# Patient Record
Sex: Female | Born: 1975 | Race: Black or African American | Hispanic: No | Marital: Single | Smoking: Never smoker
Health system: Southern US, Community
[De-identification: ages and names within clinical notes are randomized; demographics above are authoritative.]

## PROBLEM LIST (undated history)

## (undated) DIAGNOSIS — F32A Depression, unspecified: Secondary | ICD-10-CM

## (undated) DIAGNOSIS — J40 Bronchitis, not specified as acute or chronic: Secondary | ICD-10-CM

## (undated) DIAGNOSIS — F419 Anxiety disorder, unspecified: Secondary | ICD-10-CM

## (undated) DIAGNOSIS — F988 Other specified behavioral and emotional disorders with onset usually occurring in childhood and adolescence: Secondary | ICD-10-CM

## (undated) DIAGNOSIS — F329 Major depressive disorder, single episode, unspecified: Secondary | ICD-10-CM

## (undated) HISTORY — DX: Major depressive disorder, single episode, unspecified: F32.9

## (undated) HISTORY — DX: Other specified behavioral and emotional disorders with onset usually occurring in childhood and adolescence: F98.8

## (undated) HISTORY — PX: ECTOPIC PREGNANCY SURGERY: SHX613

## (undated) HISTORY — DX: Depression, unspecified: F32.A

## (undated) HISTORY — DX: Anxiety disorder, unspecified: F41.9

---

## 2012-08-22 ENCOUNTER — Emergency Department (HOSPITAL_COMMUNITY)
Admission: EM | Admit: 2012-08-22 | Discharge: 2012-08-22 | Disposition: A | Discharge status: Home / Self Care | Payer: PRIVATE HEALTH INSURANCE | Source: Home / Self Care | Attending: Family Medicine | Admitting: Family Medicine

## 2012-08-22 ENCOUNTER — Encounter (HOSPITAL_COMMUNITY): Payer: Self-pay | Admitting: Emergency Medicine

## 2012-08-22 DIAGNOSIS — F5102 Adjustment insomnia: Secondary | ICD-10-CM

## 2012-08-22 MED ORDER — ZOLPIDEM TARTRATE 10 MG PO TABS
10.0000 mg | ORAL_TABLET | Freq: Every evening | ORAL | Status: DC | PRN
Start: 2012-08-22 — End: 2012-09-04

## 2012-08-22 NOTE — ED Provider Notes (Signed)
History     CSN: [id number]  Arrival date & time 08/22/12  1520   First MD Initiated Contact with Patient 08/22/12 1615      Chief Complaint  Patient presents with  . Insomnia    (Consider location/radiation/quality/duration/timing/severity/associated sxs/prior treatment) Patient is a 36 y.o. female presenting with general illness. The history is provided by the patient.  Illness  The current episode started more than 2 weeks ago (works 3rd shift and having troule sleeping during day.). The onset was gradual. The problem has been unchanged. The problem is mild. Pertinent negatives include no headaches.    History reviewed. No pertinent past medical history.  History reviewed. No pertinent past surgical history.  No family history on file.  History  Substance Use Topics  . Smoking status: Never Smoker   . Smokeless tobacco: Not on file  . Alcohol Use: No    OB History    Grav Para Term Preterm Abortions TAB SAB Ect Mult Living                  Review of Systems  Constitutional: Negative.   Neurological: Negative for dizziness, numbness and headaches.  Psychiatric/Behavioral: Positive for sleep disturbance. Negative for dysphoric mood. The patient is not nervous/anxious.     Allergies  Review of patient's allergies indicates no known allergies.  Home Medications   Current Outpatient Rx  Name  Route  Sig  Dispense  Refill  . ZOLPIDEM TARTRATE 10 MG PO TABS   Oral   Take 1 tablet (10 mg total) by mouth at bedtime as needed for sleep.   10 tablet   0     BP 134/99  Pulse 91  Temp 98.6 F (37 C) (Oral)  Resp 16  SpO2 100%  LMP 08/16/2012  Physical Exam  Nursing note and vitals reviewed. Constitutional: She is oriented to person, place, and time. She appears well-developed and well-nourished.  HENT:  Head: Normocephalic.  Eyes: Conjunctivae normal are normal. Pupils are equal, round, and reactive to light.  Neck: Normal range of motion. Neck supple.    Lymphadenopathy:    She has no cervical adenopathy.  Neurological: She is alert and oriented to person, place, and time.  Skin: Skin is warm and dry.  Psychiatric: She has a normal mood and affect. Her behavior is normal. Judgment and thought content normal.    ED Course  Procedures (including critical care time)  Labs Reviewed - No data to display No results found.   1. Insomnia, transient       MDM          Linna Hoff, MD 08/22/12 1701

## 2012-08-22 NOTE — ED Notes (Signed)
Reports working night shift, difficulty sleeping, onset 2 weeks ago.

## 2012-09-04 ENCOUNTER — Ambulatory Visit (INDEPENDENT_AMBULATORY_CARE_PROVIDER_SITE_OTHER): Payer: Managed Care, Other (non HMO) | Admitting: Family Medicine

## 2012-09-04 VITALS — BP 124/81 | HR 93 | Temp 97.4°F | Resp 16 | Ht 62.5 in | Wt 224.6 lb

## 2012-09-04 DIAGNOSIS — G4700 Insomnia, unspecified: Secondary | ICD-10-CM

## 2012-09-04 MED ORDER — ZOLPIDEM TARTRATE 10 MG PO TABS: 10.0000 mg | ORAL_TABLET | Freq: Every evening | ORAL | Status: DC | PRN

## 2012-09-04 NOTE — Progress Notes (Signed)
Patient ID: Brandy York, female   DOB: [birth year], 36 y.o.   MRN: [id number] Brandy York is a 36 y.o. female who presents to Urgent Care today for insomnia:  1.  Insomnia: New job, working 3rd shift for the past month.  She is having great difficulty falling asleep during the day.  Presented to Urgent Care about 2 weeks ago, prescribed 10 Ambien, stated this greatly helped her falling asleep.  No trouble with sleeping before move to 3rd shift.  No history of anxiety or depression.  No narcolepsy.     PMH reviewed.  ROS as above otherwise neg.  No chest pain, palpitations, SOB, Fever, Chills, Abd pain, N/V/D.  Medications reviewed. Current Outpatient Prescriptions  Medication Sig Dispense Refill  . zolpidem (AMBIEN) 10 MG tablet Take 1 tablet (10 mg total) by mouth at bedtime as needed for sleep.  10 tablet  0    Exam:  BP 124/81  Pulse 93  Temp 97.4 F (36.3 C) (Oral)  Resp 16  Ht 5' 2.5" (1.588 m)  Wt 224 lb 9.6 oz (101.878 kg)  BMI 40.43 kg/m2  SpO2 99%  LMP 08/16/2012 Gen: Well NAD HEENT: EOMI,  MMM Lungs: CTABL Nl WOB Heart: RRR no MRG Psych:  Alert, oriented x 3, not depressed or anxious appearing, linear and coherent thought process  Assessment and Plan:  1.  Insomnia:  Short term course of Ambien.  Recommended switch to Melatonin for more long-term help with insomnia.

## 2012-09-04 NOTE — Patient Instructions (Signed)
The Ambien works well for the short-term.  A better long-term solution is Melatonin.  Start with a small dose and work up from there.  It was good to meet you today!

## 2012-09-06 NOTE — Progress Notes (Signed)
I have read, reviewed, and agree with plan of care by Dr. Gwendolyn Grant Of note if she does call back with persistent insomnia I think that using Ambien some days of the week would be ok for her particular situation.

## 2012-09-09 ENCOUNTER — Ambulatory Visit (INDEPENDENT_AMBULATORY_CARE_PROVIDER_SITE_OTHER): Payer: Managed Care, Other (non HMO) | Admitting: Emergency Medicine

## 2012-09-09 VITALS — BP 148/96 | HR 86 | Temp 99.0°F | Resp 18 | Ht 63.0 in | Wt 223.0 lb

## 2012-09-09 DIAGNOSIS — R8281 Pyuria: Secondary | ICD-10-CM

## 2012-09-09 DIAGNOSIS — N3000 Acute cystitis without hematuria: Secondary | ICD-10-CM

## 2012-09-09 LAB — POCT UA - MICROSCOPIC ONLY
Casts, Ur, LPF, POC: NEGATIVE
Yeast, UA: NEGATIVE

## 2012-09-09 LAB — POCT URINALYSIS DIPSTICK
Bilirubin, UA: NEGATIVE
Glucose, UA: NEGATIVE
Nitrite, UA: NEGATIVE
Urobilinogen, UA: 0.2

## 2012-09-09 MED ORDER — SULFAMETHOXAZOLE-TRIMETHOPRIM 800-160 MG PO TABS: 1.0000 | ORAL_TABLET | Freq: Two times a day (BID) | ORAL | Status: DC

## 2012-09-09 NOTE — Progress Notes (Signed)
[facility] [address] [place] [address] [phone]  Date:  09/09/2012   Name:  Brandy York   DOB:  09-Jul-1976   MRN:  [id number]  PCP:  No primary provider on file.    Chief Complaint: Annual Exam, Fever and Nausea   History of Present Illness:  Brandy York is a 36 y.o. very pleasant female patient who presents with the following:  Sent home from work after night shift where she was checked for dizziness.  No other symptoms.  Not sexually active. Told at work examination that she had WBC's in urine.  No other complaints.    There is no problem list on file for this patient.   History reviewed. No pertinent past medical history.  History reviewed. No pertinent past surgical history.  History  Substance Use Topics  . Smoking status: Never Smoker   . Smokeless tobacco: Not on file  . Alcohol Use: No    Family History  Problem Relation Age of Onset  . Hypertension Mother     No Known Allergies  Medication list has been reviewed and updated.  Current Outpatient Prescriptions on File Prior to Visit  Medication Sig Dispense Refill  . zolpidem (AMBIEN) 10 MG tablet Take 1 tablet (10 mg total) by mouth at bedtime as needed for sleep.  25 tablet  0    Review of Systems:  As per HPI, otherwise negative.    Physical Examination: Filed Vitals:   09/09/12 0846  BP: 148/96  Pulse: 86  Temp: 99 F (37.2 C)  Resp: 18   Filed Vitals:   09/09/12 0846  Height: 5\' 3"  (1.6 m)  Weight: 223 lb (101.152 kg)   Body mass index is 39.50 kg/(m^2). Ideal Body Weight: Weight in (lb) to have BMI = 25: 140.8   GEN: WDWN, NAD, Non-toxic, A & O x 3 HEENT: Atraumatic, Normocephalic. Neck supple. No masses, No LAD. Ears and Nose: No external deformity. CV: RRR, No M/G/R. No JVD. No thrill. No extra heart sounds. PULM: CTA B, no wheezes, crackles, rhonchi. No retractions. No resp. distress. No accessory muscle use. ABD: S, NT, ND, +BS. No  rebound. No HSM. EXTR: No c/c/e NEURO Normal gait.  PSYCH: Normally interactive. Conversant. Not depressed or anxious appearing.  Calm demeanor.    Assessment and Plan: Borderline hypertension History of pyuria UA Follow up as needed. Lose weight  Carmelina Dane, MD  Results for orders placed in visit on 09/09/12  POCT UA - MICROSCOPIC ONLY      Component Value Range   WBC, Ur, HPF, POC 4-6     RBC, urine, microscopic 0-1     Bacteria, U Microscopic 1+     Mucus, UA positive     Epithelial cells, urine per micros 6-10     Crystals, Ur, HPF, POC neg     Casts, Ur, LPF, POC neg     Yeast, UA neg    POCT URINALYSIS DIPSTICK      Component Value Range   Color, UA yellow     Clarity, UA cloudy     Glucose, UA neg     Bilirubin, UA neg     Ketones, UA trace     Spec Grav, UA 1.025     Blood, UA neg     pH, UA 6.0     Protein, UA neg     Urobilinogen, UA 0.2     Nitrite, UA neg  Leukocytes, UA small (1+)

## 2012-09-10 ENCOUNTER — Telehealth: Payer: Self-pay | Admitting: Family Medicine

## 2012-09-10 NOTE — Telephone Encounter (Signed)
patient called stating was seen yesterday and we sent in rx but pharmacy didn't have it. Since they didn't have it at [facility] wanted called in to [facility]. Called in bactrim ds 800-160mg  1 po bid #20.

## 2012-09-19 ENCOUNTER — Ambulatory Visit (INDEPENDENT_AMBULATORY_CARE_PROVIDER_SITE_OTHER): Payer: Managed Care, Other (non HMO) | Admitting: Emergency Medicine

## 2012-09-19 VITALS — BP 114/78 | HR 101 | Temp 98.7°F | Resp 16 | Ht 63.0 in | Wt 218.0 lb

## 2012-09-19 DIAGNOSIS — R8290 Unspecified abnormal findings in urine: Secondary | ICD-10-CM

## 2012-09-19 DIAGNOSIS — J018 Other acute sinusitis: Secondary | ICD-10-CM

## 2012-09-19 DIAGNOSIS — J209 Acute bronchitis, unspecified: Secondary | ICD-10-CM

## 2012-09-19 LAB — POCT URINALYSIS DIPSTICK
Blood, UA: NEGATIVE
Glucose, UA: NEGATIVE
Nitrite, UA: NEGATIVE
Protein, UA: NEGATIVE
Urobilinogen, UA: 0.2

## 2012-09-19 LAB — POCT UA - MICROSCOPIC ONLY
Crystals, Ur, HPF, POC: NEGATIVE
Yeast, UA: NEGATIVE

## 2012-09-19 MED ORDER — HYDROCOD POLST-CHLORPHEN POLST 10-8 MG/5ML PO LQCR: 5.0000 mL | Freq: Two times a day (BID) | ORAL | Status: DC | PRN

## 2012-09-19 MED ORDER — PSEUDOEPHEDRINE-GUAIFENESIN ER 60-600 MG PO TB12: 1.0000 | ORAL_TABLET | Freq: Two times a day (BID) | ORAL | Status: DC

## 2012-09-19 MED ORDER — AMOXICILLIN-POT CLAVULANATE 875-125 MG PO TABS: 1.0000 | ORAL_TABLET | Freq: Two times a day (BID) | ORAL | Status: DC

## 2012-09-19 NOTE — Progress Notes (Signed)
[facility] [address], [place] [address] [phone]  Date:  09/19/2012   Name:  Brandy York   DOB:  1976/02/04   MRN:  [id number]  PCP:  No primary provider on file.    Chief Complaint: Follow-up and Laryngitis   History of Present Illness:  Brandy York is a 36 y.o. very pleasant female patient who presents with the following:  Seen 11/25 for cystitis and did not comply with treatment.  Finished five days.  Now complaining of dysuria and frequency. No vaginal discharge or bleeding. No dyspareunia.  Has URI.  Has a cough productive green sputum no wheezing or shortness of breath.  Has green post nasal drainage. No fever or chills.  No nausea or vomiting.  No headache or sinus pain or pressure.  No improvement in symptoms with OTC medication.  There is no problem list on file for this patient.   History reviewed. No pertinent past medical history.  History reviewed. No pertinent past surgical history.  History  Substance Use Topics  . Smoking status: Never Smoker   . Smokeless tobacco: Not on file  . Alcohol Use: No    Family History  Problem Relation Age of Onset  . Hypertension Mother     No Known Allergies  Medication list has been reviewed and updated.  Current Outpatient Prescriptions on File Prior to Visit  Medication Sig Dispense Refill  . zolpidem (AMBIEN) 10 MG tablet Take 1 tablet (10 mg total) by mouth at bedtime as needed for sleep.  25 tablet  0    Review of Systems:  As per HPI, otherwise negative.     Physical Examination: Filed Vitals:   09/19/12 1611  BP: 114/78  Pulse: 101  Temp: 98.7 F (37.1 C)  Resp: 16   Filed Vitals:   09/19/12 1611  Height: 5\' 3"  (1.6 m)  Weight: 218 lb (98.884 kg)   Body mass index is 38.62 kg/(m^2). Ideal Body Weight: Weight in (lb) to have BMI = 25: 140.8   GEN: WDWN, NAD, Non-toxic, A & O x 3 HEENT: Atraumatic, Normocephalic. Neck supple. No masses, No LAD. Ears and  Nose: No external deformity. CV: RRR, No M/G/R. No JVD. No thrill. No extra heart sounds. PULM: CTA B, no wheezes, crackles, rhonchi. No retractions. No resp. distress. No accessory muscle use. ABD: S, NT, ND, +BS. No rebound. No HSM. EXTR: No c/c/e NEURO Normal gait.  PSYCH: Normally interactive. Conversant. Not depressed or anxious appearing.  Calm demeanor.    Assessment and Plan: Sinusitis Bronchitis augmentin mucinex tussionex Follow up as needed  Urine c&s  Carmelina Dane, MD  Results for orders placed in visit on 09/19/12  POCT URINALYSIS DIPSTICK      Component Value Range   Color, UA yellow     Clarity, UA clear     Glucose, UA neg     Bilirubin, UA neg     Ketones, UA trace     Spec Grav, UA 1.025     Blood, UA neg     pH, UA 5.5     Protein, UA neg     Urobilinogen, UA 0.2     Nitrite, UA neg     Leukocytes, UA Trace    POCT UA - MICROSCOPIC ONLY      Component Value Range   WBC, Ur, HPF, POC 3-8     RBC, urine, microscopic 0-1     Bacteria, U Microscopic trace  Mucus, UA trace     Epithelial cells, urine per micros 4-10     Crystals, Ur, HPF, POC neg     Casts, Ur, LPF, POC neg     Yeast, UA neg

## 2012-09-21 LAB — URINE CULTURE
Colony Count: NO GROWTH
Organism ID, Bacteria: NO GROWTH

## 2012-09-23 NOTE — Progress Notes (Signed)
Reviewed and agree.

## 2012-09-27 ENCOUNTER — Telehealth: Payer: Self-pay | Admitting: Radiology

## 2012-09-27 ENCOUNTER — Telehealth: Payer: Self-pay

## 2012-09-27 NOTE — Telephone Encounter (Signed)
Patient called 5 days ago and again today (09/27/12) and we told her we were sending her prescription for cough syrup and maybe an antibiotic that she expected to be called in to [facility] on [address] and someone told her they were "on it" and [facility] still hasn't received anything. [phone]

## 2012-09-27 NOTE — Telephone Encounter (Signed)
Patient states she did not get her Rx for antibiotic that was sent in for her on 09/19/12, I called [facility], left message since I could not get an answer at the pharmacy.

## 2012-09-30 NOTE — Telephone Encounter (Signed)
I called in the antibiotic, but she had already gotten it. I did not tell her we would renew the cough meds, if she needs renewal on the cough meds, she needs to return to clinic. I have left message for her, to clarify. Advised if she has questions she is to call me back.

## 2012-10-03 ENCOUNTER — Telehealth: Payer: Self-pay

## 2012-10-03 NOTE — Telephone Encounter (Signed)
Ok to give her #30 ambien 10 mg with 1RF.  However, please call her and remind there that using ambien every day can increase risk of dependence.  Also, as she is a female I would recommend that she try a 1/2 tablet as this may be sufficient.

## 2012-10-03 NOTE — Telephone Encounter (Signed)
PT IN NEED OF HER AMBIEN. PLEASE CALL [phone]

## 2012-10-03 NOTE — Telephone Encounter (Signed)
Please advise, does this go to Dr Patsy Lager or Dr Gwendolyn Grant?

## 2012-10-04 ENCOUNTER — Telehealth: Payer: Self-pay | Admitting: Radiology

## 2012-10-04 MED ORDER — ZOLPIDEM TARTRATE 10 MG PO TABS: 5.0000 mg | ORAL_TABLET | Freq: Every evening | ORAL | Status: DC | PRN

## 2012-10-04 NOTE — Telephone Encounter (Signed)
Called in her Rx again, she states they did not get it.

## 2012-10-04 NOTE — Telephone Encounter (Signed)
Patient advised.

## 2012-10-18 ENCOUNTER — Ambulatory Visit (INDEPENDENT_AMBULATORY_CARE_PROVIDER_SITE_OTHER): Payer: Managed Care, Other (non HMO) | Admitting: Family Medicine

## 2012-10-18 VITALS — BP 132/98 | HR 117 | Temp 98.6°F | Resp 16 | Ht 60.5 in | Wt 217.0 lb

## 2012-10-18 DIAGNOSIS — F341 Dysthymic disorder: Secondary | ICD-10-CM

## 2012-10-18 DIAGNOSIS — F4310 Post-traumatic stress disorder, unspecified: Secondary | ICD-10-CM | POA: Insufficient documentation

## 2012-10-18 DIAGNOSIS — F988 Other specified behavioral and emotional disorders with onset usually occurring in childhood and adolescence: Secondary | ICD-10-CM

## 2012-10-18 DIAGNOSIS — F418 Other specified anxiety disorders: Secondary | ICD-10-CM | POA: Insufficient documentation

## 2012-10-18 LAB — POCT CBC
Granulocyte percent: 49.2 %G (ref 37–80)
HCT, POC: 40.9 % (ref 37.7–47.9)
Hemoglobin: 12.8 g/dL (ref 12.2–16.2)
MCV: 86 fL (ref 80–97)
POC Granulocyte: 3.6 (ref 2–6.9)
POC LYMPH PERCENT: 43.5 %L (ref 10–50)
RBC: 4.75 M/uL (ref 4.04–5.48)
RDW, POC: 15.7 %

## 2012-10-18 LAB — COMPREHENSIVE METABOLIC PANEL
Albumin: 4.1 g/dL (ref 3.5–5.2)
Alkaline Phosphatase: 39 U/L (ref 39–117)
BUN: 9 mg/dL (ref 6–23)
CO2: 27 mEq/L (ref 19–32)
Calcium: 9.4 mg/dL (ref 8.4–10.5)
Chloride: 101 mEq/L (ref 96–112)
Glucose, Bld: 81 mg/dL (ref 70–99)
Potassium: 4.1 mEq/L (ref 3.5–5.3)
Sodium: 134 mEq/L — ABNORMAL LOW (ref 135–145)
Total Protein: 7.3 g/dL (ref 6.0–8.3)

## 2012-10-18 LAB — TSH: TSH: 2.767 u[IU]/mL (ref 0.350–4.500)

## 2012-10-18 MED ORDER — FLUOXETINE HCL 20 MG PO TABS: 20.0000 mg | ORAL_TABLET | Freq: Every day | ORAL | Status: DC

## 2012-10-18 MED ORDER — AMPHETAMINE-DEXTROAMPHET ER 20 MG PO CP24: 20.0000 mg | ORAL_CAPSULE | ORAL | Status: DC

## 2012-10-18 NOTE — Progress Notes (Signed)
[address]   [place], [place]  [address]   [phone]  Subjective:    Patient ID: Brandy York, female    DOB: [birth year], 37 y.o.   MRN: [id number]  HPIThis 37 y.o. female presents for evaluation of ADD.  Recently diagnosed with ADD today by Jasmine December of [facility] in [place].  Recommended following up with PCP for treatment of ADD and PTSD.  Also diagnosed with PTSD today.  Therapist recommended medication and counseling. Mother with ADD, 2 sisters with ADD.  Chronic struggle with concentrating.  Did not want to take medication as child; was embarrassed to take medications.  Sisters have taken medication but hid it.  Mother and sisters all take Adderall.  Never taken medication of family members.  No worsening attention issues; tired of lack of focus.  No previous treatment for depression.  Recently had SI but will not commit suicide due to children.  Anger issues.  Single mom; unable to concentrate at work.  Job is on the line due to poor performance; pharmaceutical work; make medication in factory.  THree children; all three children live with patinet (2, 9,18).  Mom takes Klonopin for depression/anxiety.  No recent suicide attempts.  Appointment with counselor at follow-up in one month.   Review of Systems  Constitutional: Negative for chills, diaphoresis and fatigue.  Respiratory: Negative for shortness of breath.   Cardiovascular: Negative for chest pain, palpitations and leg swelling.  Psychiatric/Behavioral: Positive for suicidal ideas, sleep disturbance, dysphoric mood and decreased concentration. Negative for hallucinations, behavioral problems, confusion, self-injury and agitation. The patient is not nervous/anxious and is not hyperactive.         History reviewed. No pertinent past medical history.  History reviewed. No pertinent past surgical history.  Prior to Admission medications   Medication Sig Start Date End Date Taking? Authorizing Provider  zolpidem  (AMBIEN) 10 MG tablet Take 0.5-1 tablets (5-10 mg total) by mouth at bedtime as needed for sleep. 10/04/12 11/03/12 Yes Gwenlyn Found Copland, MD  amoxicillin-clavulanate (AUGMENTIN) 875-125 MG per tablet Take 1 tablet by mouth 2 (two) times daily. 09/19/12   Phillips Odor, MD  amphetamine-dextroamphetamine (ADDERALL XR) 20 MG 24 hr capsule Take 1 capsule (20 mg total) by mouth every morning. 10/18/12   Ethelda Chick, MD  chlorpheniramine-HYDROcodone (TUSSIONEX PENNKINETIC ER) 10-8 MG/5ML LQCR Take 5 mLs by mouth every 12 (twelve) hours as needed (cough). 09/19/12   Phillips Odor, MD  FLUoxetine (PROZAC) 20 MG tablet Take 1 tablet (20 mg total) by mouth daily. 10/18/12   Ethelda Chick, MD  pseudoephedrine-guaifenesin (MUCINEX D) 60-600 MG per tablet Take 1 tablet by mouth every 12 (twelve) hours. 09/19/12 09/19/13  Phillips Odor, MD    No Known Allergies  History   Social History  . Marital Status: Single    Spouse Name: N/A    Number of Children: N/A  . Years of Education: N/A   Occupational History  . Not on file.   Social History Main Topics  . Smoking status: Never Smoker   . Smokeless tobacco: Not on file  . Alcohol Use: No  . Drug Use: No  . Sexually Active: No   Other Topics Concern  . Not on file   Social History Narrative   Marital: single; not dating.  Recently moved from [place] to [place].   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 Hypertension Mother   . Depression Mother   . ADD / ADHD Mother   . ADD / ADHD Father   . ADD / ADHD Sister     Objective:   Physical Exam  Nursing note and vitals reviewed. Constitutional: She is oriented to person, place, and time. She appears well-developed and well-nourished. No distress.  Neck: Normal range of motion. Neck supple. No thyromegaly  present.  Cardiovascular: Normal rate, regular rhythm and normal heart sounds.   No murmur heard.      Rate 98  Pulmonary/Chest: Effort normal and breath sounds normal. She has no wheezes. She has no rales.  Neurological: She is alert and oriented to person, place, and time. No cranial nerve deficit. She exhibits normal muscle tone. Coordination normal.  Skin: She is not diaphoretic.  Psychiatric: She has a normal mood and affect. Her behavior is normal. Judgment and thought content normal.    EKG:  NSR.      Assessment & Plan:   1. ADD (attention deficit disorder)  POCT CBC, Comprehensive metabolic panel, TSH, EKG 12-Lead  2. Depression with anxiety    3. PTSD (post-traumatic stress disorder)       1.  ADD:  Newly diagnosed today by therapist in [place]; records reviewed during visit.  All family members treated with Adderall and patient requesting trial of Adderall XR 20mg  daily and agreeable.  Rx for Adderall XR 20mg  daily provided; no refills; to follow-up in one month.  Baseline EKG obtained; obtain TSH, CMET.  Discussed office policy on following up with same provider at each visit; no early refills of Adderall.  Poor job performance recently.   2.  Depression with Anxiety:  New.  Intermittent SI; contracts safety; rx for Prozac 20mg  daily provided.  If no improvement in depression with treatment of ADD and depression, will warrant early referral to psychiatry and pt expresses understanding. 3. PTSD:  New.  Diagnosed today by therapist; scheduled to participate in regular therapy.    Meds ordered this encounter  Medications  . amphetamine-dextroamphetamine (ADDERALL XR) 20 MG 24 hr capsule    Sig: Take 1 capsule (20 mg total) by mouth every morning.    Dispense:  30 capsule    Refill:  0  . FLUoxetine (PROZAC) 20 MG tablet    Sig: Take 1 tablet (20 mg total) by mouth daily.    Dispense:  30 tablet    Refill:  3

## 2012-10-18 NOTE — Patient Instructions (Addendum)
1. ADD (attention deficit disorder)  POCT CBC, Comprehensive metabolic panel, TSH, EKG 12-Lead  2. Depression with anxiety    3. PTSD (post-traumatic stress disorder)

## 2012-10-21 NOTE — Progress Notes (Signed)
Left msg for pt to schedule 1 month recheck with Dr. Katrinka Blazing. Brandy York

## 2012-10-23 NOTE — Progress Notes (Signed)
Appt made for 2/4 (patient on new shift at work).

## 2012-11-14 ENCOUNTER — Ambulatory Visit (INDEPENDENT_AMBULATORY_CARE_PROVIDER_SITE_OTHER): Payer: Managed Care, Other (non HMO) | Admitting: Family Medicine

## 2012-11-14 VITALS — BP 114/80 | HR 108 | Temp 98.8°F | Resp 16 | Ht 62.5 in | Wt 218.6 lb

## 2012-11-14 DIAGNOSIS — F4310 Post-traumatic stress disorder, unspecified: Secondary | ICD-10-CM

## 2012-11-14 DIAGNOSIS — G4700 Insomnia, unspecified: Secondary | ICD-10-CM

## 2012-11-14 DIAGNOSIS — T7491XA Unspecified adult maltreatment, confirmed, initial encounter: Secondary | ICD-10-CM

## 2012-11-14 DIAGNOSIS — F341 Dysthymic disorder: Secondary | ICD-10-CM

## 2012-11-14 DIAGNOSIS — F988 Other specified behavioral and emotional disorders with onset usually occurring in childhood and adolescence: Secondary | ICD-10-CM

## 2012-11-14 DIAGNOSIS — F418 Other specified anxiety disorders: Secondary | ICD-10-CM

## 2012-11-14 MED ORDER — BUSPIRONE HCL 7.5 MG PO TABS: ORAL_TABLET | ORAL | Status: DC

## 2012-11-14 MED ORDER — ZOLPIDEM TARTRATE 5 MG PO TABS: 5.0000 mg | ORAL_TABLET | Freq: Every evening | ORAL | Status: DC | PRN

## 2012-11-14 MED ORDER — AMPHETAMINE-DEXTROAMPHET ER 20 MG PO CP24: 20.0000 mg | ORAL_CAPSULE | ORAL | Status: DC
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
## 2012-11-14 NOTE — Patient Instructions (Addendum)
1. ADD (attention deficit disorder)   2. Depression with anxiety   3. PTSD (post-traumatic stress disorder)

## 2012-11-15 DIAGNOSIS — G4700 Insomnia, unspecified: Secondary | ICD-10-CM | POA: Insufficient documentation

## 2012-11-15 DIAGNOSIS — T7491XA Unspecified adult maltreatment, confirmed, initial encounter: Secondary | ICD-10-CM | POA: Insufficient documentation

## 2012-11-15 NOTE — Assessment & Plan Note (Signed)
Recurrent due to acute stressors; rx for Ambien 5mg  qhs provided.

## 2012-11-15 NOTE — Assessment & Plan Note (Signed)
Improved; refill of Adderall XR 20mg  daily provided; follow-up in one month.

## 2012-11-15 NOTE — Assessment & Plan Note (Signed)
New. Counseling provided.  Has scheduled appointment with therapist in upcoming week.

## 2012-11-15 NOTE — Assessment & Plan Note (Signed)
Persistent/stable; will switch Prozac to Buspar.
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
## 2012-11-19 ENCOUNTER — Ambulatory Visit: Payer: Managed Care, Other (non HMO) | Admitting: Family Medicine

## 2012-11-20 ENCOUNTER — Telehealth: Payer: Self-pay

## 2012-11-20 ENCOUNTER — Ambulatory Visit (INDEPENDENT_AMBULATORY_CARE_PROVIDER_SITE_OTHER): Payer: Managed Care, Other (non HMO) | Admitting: Family Medicine

## 2012-11-20 VITALS — BP 150/88 | HR 116 | Temp 98.3°F | Resp 16 | Ht 62.0 in | Wt 208.0 lb

## 2012-11-20 DIAGNOSIS — R6883 Chills (without fever): Secondary | ICD-10-CM

## 2012-11-20 DIAGNOSIS — R05 Cough: Secondary | ICD-10-CM

## 2012-11-20 LAB — POCT INFLUENZA A/B
Influenza A, POC: NEGATIVE
Influenza B, POC: NEGATIVE

## 2012-11-20 MED ORDER — BENZONATATE 100 MG PO CAPS: 100.0000 mg | ORAL_CAPSULE | Freq: Two times a day (BID) | ORAL | Status: DC | PRN

## 2012-11-20 NOTE — Patient Instructions (Signed)
Very nice to meet you. I think you do have a viral-like illness that is not the flu. With going to be most important is to make sure you take in plenty of fluids. I'm giving you a medicine called Tessalon Perles to help with her cough. I will give you a note to keep you out from work for tomorrow but I think you can start working again on Friday. A few actually get a fever or have significant worsening of problems please come back again in 48 hours. Otherwise followup next week.  Viral Infections A virus is a type of germ. Viruses can cause:  Minor sore throats.  Aches and pains.  Headaches.  Runny nose.  Rashes.  Watery eyes.  Tiredness.  Coughs.  Loss of appetite.  Feeling sick to your stomach (nausea).  Throwing up (vomiting).  Watery poop (diarrhea). HOME CARE   Only take medicines as told by your doctor.  Drink enough water and fluids to keep your pee (urine) clear or pale yellow. Sports drinks are a good choice.  Get plenty of rest and eat healthy. Soups and broths with crackers or rice are fine. GET HELP RIGHT AWAY IF:   You have a very bad headache.  You have shortness of breath.  You have chest pain or neck pain.  You have an unusual rash.  You cannot stop throwing up.  You have watery poop that does not stop.  You cannot keep fluids down.  You or your child has a temperature by mouth above 102 F (38.9 C), not controlled by medicine.  Your baby is older than 3 months with a rectal temperature of 102 F (38.9 C) or higher.  Your baby is 40 months old or younger with a rectal temperature of 100.4 F (38 C) or higher. MAKE SURE YOU:   Understand these instructions.  Will watch this condition.  Will get help right away if you are not doing well or get worse. Document Released: 09/14/2008 Document Revised: 12/25/2011 Document Reviewed: 02/07/2011 [facility] Patient Information 2013 [place], [place].

## 2012-11-20 NOTE — Progress Notes (Signed)
Chief complaint: Cough x2 days  History of present illness: This is a 37 year old female with no significant past medical history coming in with two-day cough. Patient states the cough is somewhat productive with yellow mucus. Patient states that she has felt like she had a fever but nothing documented. Patient states that she's had a headache as well as some muscle and body aches. Patient denies any vomiting but does have some nausea. Patient has had recent sick contacts with another individual but does not know what she had. Patient states that she did get her flu shot. Patient states that anything that she's tried at home so far has not helped and she has not slept well over the course of the last 2 nights.  Past medical history, social, surgical and family history all reviewed.   Denies fever, chills, nausea vomiting abdominal pain, dysuria, chest pain, shortness of breath dyspnea on exertion or numbness in extremities  physicla exam Blood pressure 150/88, pulse 116, temperature 98.3 F (36.8 C), temperature source Oral, resp. rate 16, height 5\' 2"  (1.575 m), weight 208 lb (94.348 kg), last menstrual period 11/04/2012, SpO2 99.00%. General: No apparent distress alert and oriented female who appears somewhat confrontational. HEENT: Pupils equal round reactive to light and accommodation, extraocular motion intact. Tympanic membranes normal, posterior pharynx has some mild erythema but no enlargement of the tonsils or adenoids. Patient has no cervical lymphadenopathy. Pulmonary: Clear to auscultation bilaterally Cardiovascular: Regular rate and rhythm no murmur appreciated Skin: No signs or rash erythema or signs of infection. Extremities: 2+ distal pulses neurovascularly intact in all extremities.  Labs: Flu swab as well as strep swab are negative  Assessment: Viral-like illness  Plan: Press fluids Counseling on red flags Patient given prescription for Tessalon Perles. Patient asked for  hydrocodone which I declined to give. Patient was frustrated with this. Patient told to return in 48 hours if not better. Patient also given a note to keep out of work for the next 24 hours.

## 2012-11-20 NOTE — Telephone Encounter (Signed)
Pt states that she is having a bad cough. Pt would like a cough medication sent over to her pharmacy. Best# [phone] Pharmacy:[facility]

## 2012-11-20 NOTE — Telephone Encounter (Signed)
Spoke with pt, advised she would have to come in for an office visit to get a RX for cough. Pt understood.

## 2012-11-22 ENCOUNTER — Telehealth: Payer: Self-pay

## 2012-11-22 NOTE — Telephone Encounter (Addendum)
PT STATES SHE WAS JUST SEEN BY DR ZAC York AND IS STILL REALLY SICK. DO NOT FEEL LIKE COMING BACK IN BUT WOULD LIKE A CALL BACK AT [phone] SHE IS Brandy York'S PT AND WAS SHE WAS GIVEN YESTERDAY MAKE HER SICKER

## 2012-11-22 NOTE — Telephone Encounter (Signed)
Pt states that she still has cough and congestion and that is worse than when she was here. She also has no appetite.  She would like message to be sent to Dr. Nilda Simmer only

## 2012-11-22 NOTE — Telephone Encounter (Signed)
Press fluids  Counseling on red flags  Patient given prescription for Tessalon Perles. Patient asked for hydrocodone which I declined to give. Patient was frustrated with this.  Patient told to return in 48 hours if not better.  Patient also given a note to keep out of work for the next 24 hours.    Dr Katrinka Blazing, Ian Malkin, had recommended patient return to clinic if no better. She should return to clinic if she is worse.

## 2012-11-22 NOTE — Telephone Encounter (Signed)
Patient states the medication she was given is making her feel worse. She states this is an emergency and would like a call back asap. [phone]

## 2012-11-23 MED ORDER — HYDROCOD POLST-CHLORPHEN POLST 10-8 MG/5ML PO LQCR: 5.0000 mL | Freq: Two times a day (BID) | ORAL | Status: DC | PRN

## 2012-11-23 MED ORDER — MUCINEX DM 30-600 MG PO TB12: 1.0000 | ORAL_TABLET | Freq: Two times a day (BID) | ORAL | Status: DC | PRN

## 2012-11-23 MED ORDER — AZITHROMYCIN 250 MG PO TABS: ORAL_TABLET | ORAL | Status: DC

## 2012-11-23 NOTE — Telephone Encounter (Signed)
Please call pt --- if she has developed fever, shortness of breath then very important to return to clinic to rule out development of pneumonia.  If cough has just worsened, I have sent in Zpack, Mucinex DM, and will fax in Tussionex cough syrup (which has hydrocodone in it and will make her drowsy).  Again, very important to return to clinic if has shortness of breath.

## 2012-11-24 ENCOUNTER — Telehealth: Payer: Self-pay

## 2012-11-24 NOTE — Telephone Encounter (Signed)
erin called pt to let her know her rx was sent to pharmacy.

## 2012-11-24 NOTE — Telephone Encounter (Signed)
Error

## 2012-11-25 NOTE — Telephone Encounter (Signed)
Sent to scheduling in error, think clinical did not finish documenting

## 2012-12-17 ENCOUNTER — Ambulatory Visit: Payer: Managed Care, Other (non HMO) | Admitting: Family Medicine

## 2013-01-07 ENCOUNTER — Telehealth: Payer: Self-pay

## 2013-01-07 NOTE — Telephone Encounter (Signed)
This is the first request I have gotten. I called her to advise I am sending it to Dr Katrinka Blazing.

## 2013-01-07 NOTE — Telephone Encounter (Signed)
PT SAYS SHE CALLED TWO DAYS AGO FOR A REFILL ON HER AMBIEN, BUT HAS NOT HEARD FROM [place].  PLEASE CALL [phone]

## 2013-01-08 NOTE — Telephone Encounter (Signed)
I have spoken to her. She states she is taking 2 at bedtime. I advised her this is too many and she is at risk for death. She is advised we will not give her this again.

## 2013-01-08 NOTE — Telephone Encounter (Signed)
Left message for her to call me back. Brandy York

## 2013-01-08 NOTE — Telephone Encounter (Signed)
Call -- I have been contacted by pharmacist that patient is receiving Ambien from multiple providers each month; I have documented that patient filled 100 Ambien last month and the month before. I will not refill Ambien further for patient.

## 2013-01-27 ENCOUNTER — Encounter: Payer: Self-pay | Admitting: Family Medicine

## 2013-01-27 ENCOUNTER — Ambulatory Visit (INDEPENDENT_AMBULATORY_CARE_PROVIDER_SITE_OTHER): Payer: BC Managed Care – PPO | Admitting: Family Medicine

## 2013-01-27 VITALS — BP 130/88 | HR 128 | Temp 99.3°F | Resp 18 | Ht 63.0 in | Wt 204.0 lb

## 2013-01-27 DIAGNOSIS — F988 Other specified behavioral and emotional disorders with onset usually occurring in childhood and adolescence: Secondary | ICD-10-CM

## 2013-01-27 DIAGNOSIS — R05 Cough: Secondary | ICD-10-CM

## 2013-01-27 DIAGNOSIS — Z9114 Patient's other noncompliance with medication regimen: Secondary | ICD-10-CM

## 2013-01-27 DIAGNOSIS — J069 Acute upper respiratory infection, unspecified: Secondary | ICD-10-CM

## 2013-01-27 DIAGNOSIS — G4700 Insomnia, unspecified: Secondary | ICD-10-CM

## 2013-01-27 DIAGNOSIS — F341 Dysthymic disorder: Secondary | ICD-10-CM

## 2013-01-27 DIAGNOSIS — Z9119 Patient's noncompliance with other medical treatment and regimen: Secondary | ICD-10-CM

## 2013-01-27 DIAGNOSIS — F418 Other specified anxiety disorders: Secondary | ICD-10-CM

## 2013-01-27 DIAGNOSIS — T7491XD Unspecified adult maltreatment, confirmed, subsequent encounter: Secondary | ICD-10-CM

## 2013-01-27 DIAGNOSIS — F4310 Post-traumatic stress disorder, unspecified: Secondary | ICD-10-CM

## 2013-01-27 DIAGNOSIS — R509 Fever, unspecified: Secondary | ICD-10-CM

## 2013-01-27 LAB — POCT CBC
Granulocyte percent: 58.4 %G (ref 37–80)
HCT, POC: 38.5 % (ref 37.7–47.9)
Hemoglobin: 12.2 g/dL (ref 12.2–16.2)
Lymph, poc: 2.8 (ref 0.6–3.4)
MCHC: 31.7 g/dL — AB (ref 31.8–35.4)
MPV: 11.1 fL (ref 0–99.8)
POC Granulocyte: 4.7 (ref 2–6.9)
POC MID %: 7 %M (ref 0–12)

## 2013-01-27 LAB — POCT INFLUENZA A/B
Influenza A, POC: NEGATIVE
Influenza B, POC: NEGATIVE

## 2013-01-27 MED ORDER — AMPHETAMINE-DEXTROAMPHET ER 20 MG PO CP24: 20.0000 mg | ORAL_CAPSULE | ORAL | Status: AC

## 2013-01-27 MED ORDER — AZITHROMYCIN 250 MG PO TABS: ORAL_TABLET | ORAL | Status: DC
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
## 2013-02-10 ENCOUNTER — Ambulatory Visit: Payer: Managed Care, Other (non HMO) | Admitting: Family Medicine

## 2013-02-27 ENCOUNTER — Telehealth: Payer: Self-pay

## 2013-02-27 ENCOUNTER — Encounter: Payer: Self-pay | Admitting: Family Medicine

## 2013-02-27 NOTE — Telephone Encounter (Signed)
Pt wants to talk to dr Katrinka Blazing. Would not tell me why.  Best: [phone]  bf

## 2013-02-28 NOTE — Telephone Encounter (Signed)
Pt called back again requesting refill on rx for her bronchitis.   Pharmacy: [facility] [address]  Best: [phone]  bf

## 2013-02-28 NOTE — Telephone Encounter (Signed)
According to your last note patient dismissed from practice: Per office policy for prescribing controlled substances, patient will be dismissed from office. Pt expressed understanding; she was encouraged to seek primary care from other provider. We will provider emergency care to her for the next thirty days only.  It has been 30 days, do you want to see her regarding the complaints of bronchitis symptoms?

## 2013-03-01 NOTE — Telephone Encounter (Signed)
Pt called again wanting a response from Dr. Katrinka Blazing

## 2013-03-02 NOTE — Telephone Encounter (Signed)
Call --- patient's visit for bronchitis was 02/26/13; thus, it has been more than one month since last visit. Her 30 days of emergency care has expired.  Unfortunately, she will need to be evaluated at another clinic for further treatment of bronchitis.

## 2013-03-02 NOTE — Telephone Encounter (Signed)
lmom that we are unable to fill medication for bronchitis.

## 2014-04-07 ENCOUNTER — Inpatient Hospital Stay (HOSPITAL_COMMUNITY): Payer: BC Managed Care – PPO

## 2014-04-07 ENCOUNTER — Inpatient Hospital Stay (HOSPITAL_COMMUNITY)
Admission: AD | Admit: 2014-04-07 | Discharge: 2014-04-07 | Disposition: A | Discharge status: Home / Self Care | Payer: BC Managed Care – PPO | Source: Ambulatory Visit | Attending: Obstetrics and Gynecology | Admitting: Obstetrics and Gynecology

## 2014-04-07 ENCOUNTER — Encounter (HOSPITAL_COMMUNITY): Payer: Self-pay | Admitting: *Deleted

## 2014-04-07 DIAGNOSIS — R109 Unspecified abdominal pain: Secondary | ICD-10-CM | POA: Insufficient documentation

## 2014-04-07 DIAGNOSIS — N83202 Unspecified ovarian cyst, left side: Secondary | ICD-10-CM

## 2014-04-07 DIAGNOSIS — F341 Dysthymic disorder: Secondary | ICD-10-CM | POA: Insufficient documentation

## 2014-04-07 DIAGNOSIS — F988 Other specified behavioral and emotional disorders with onset usually occurring in childhood and adolescence: Secondary | ICD-10-CM | POA: Insufficient documentation

## 2014-04-07 DIAGNOSIS — D259 Leiomyoma of uterus, unspecified: Secondary | ICD-10-CM | POA: Insufficient documentation

## 2014-04-07 DIAGNOSIS — N912 Amenorrhea, unspecified: Secondary | ICD-10-CM | POA: Insufficient documentation

## 2014-04-07 DIAGNOSIS — N83209 Unspecified ovarian cyst, unspecified side: Secondary | ICD-10-CM

## 2014-04-07 HISTORY — DX: Bronchitis, not specified as acute or chronic: J40

## 2014-04-07 LAB — CBC WITH DIFFERENTIAL/PLATELET
Basophils Absolute: 0 10*3/uL (ref 0.0–0.1)
Basophils Relative: 0 % (ref 0–1)
Eosinophils Absolute: 0.1 10*3/uL (ref 0.0–0.7)
Eosinophils Relative: 1 % (ref 0–5)
HCT: 38.9 % (ref 36.0–46.0)
HEMOGLOBIN: 13.3 g/dL (ref 12.0–15.0)
LYMPHS ABS: 3.3 10*3/uL (ref 0.7–4.0)
LYMPHS PCT: 34 % (ref 12–46)
MCH: 30 pg (ref 26.0–34.0)
MCHC: 34.2 g/dL (ref 30.0–36.0)
MCV: 87.8 fL (ref 78.0–100.0)
MONOS PCT: 6 % (ref 3–12)
Monocytes Absolute: 0.6 10*3/uL (ref 0.1–1.0)
NEUTROS ABS: 5.7 10*3/uL (ref 1.7–7.7)
NEUTROS PCT: 59 % (ref 43–77)
Platelets: 229 10*3/uL (ref 150–400)
RBC: 4.43 MIL/uL (ref 3.87–5.11)
RDW: 13.9 % (ref 11.5–15.5)
WBC: 9.6 10*3/uL (ref 4.0–10.5)

## 2014-04-07 LAB — WET PREP, GENITAL
TRICH WET PREP: NONE SEEN
YEAST WET PREP: NONE SEEN

## 2014-04-07 LAB — URINALYSIS, ROUTINE W REFLEX MICROSCOPIC
BILIRUBIN URINE: NEGATIVE
Glucose, UA: NEGATIVE mg/dL
Hgb urine dipstick: NEGATIVE
Ketones, ur: 15 mg/dL — AB
Leukocytes, UA: NEGATIVE
NITRITE: NEGATIVE
PH: 5.5 (ref 5.0–8.0)
Protein, ur: NEGATIVE mg/dL
Urobilinogen, UA: 0.2 mg/dL (ref 0.0–1.0)

## 2014-04-07 LAB — HCG, QUANTITATIVE, PREGNANCY

## 2014-04-07 LAB — ABO/RH: ABO/RH(D): A POS

## 2014-04-07 MED ORDER — HYDROCODONE-ACETAMINOPHEN 5-325 MG PO TABS: 1.0000 | ORAL_TABLET | Freq: Four times a day (QID) | ORAL | Status: AC | PRN

## 2014-04-07 MED ORDER — KETOROLAC TROMETHAMINE 60 MG/2ML IM SOLN
30.0000 mg | Freq: Once | INTRAMUSCULAR | Status: AC
  Administered 2014-04-07: 30 mg via INTRAMUSCULAR

## 2014-04-07 MED ORDER — NAPROXEN 500 MG PO TABS: 500.0000 mg | ORAL_TABLET | Freq: Two times a day (BID) | ORAL | Status: AC

## 2014-04-07 NOTE — Discharge Instructions (Signed)
Your ultrasound tonight shows that you have a cyst on your left ovary and a small fibroid. Call the GYN office to schedule a follow up appointment. Return here as needed for problems.

## 2014-04-07 NOTE — MAU Note (Signed)
Patient states she has had a positive home pregnancy test. States she started having mid abdominal pain at 1700. Denies bleeding or discharge. States she has a cough and mucus. Has some nausea, no vomiting.

## 2014-04-07 NOTE — MAU Provider Note (Signed)
CSN: [id number]     Arrival date & time 04/07/14  1747 History   None    Chief Complaint  Patient presents with  . Abdominal Pain  . Possible Pregnancy  . Nausea  . Cough     (Consider location/radiation/quality/duration/timing/severity/associated sxs/prior Treatment) Patient is a 38 y.o. female presenting with abdominal pain. The history is provided by the patient.  Abdominal Pain The primary symptoms of the illness include abdominal pain. The primary symptoms of the illness do not include fever, shortness of breath, nausea, vomiting, dysuria, vaginal discharge or vaginal bleeding. The current episode started 3 to 5 hours ago. The onset of the illness was sudden.  The patient has not had a change in bowel habit. Symptoms associated with the illness do not include chills, anorexia, constipation, urgency, hematuria, frequency or back pain.   Brittanie Dosanjh is a 38 y.o. G2P1011 who presents to the MAU with amenorrhea. She reports that her LMP was 01/14/2014. She had a positive HPT. She denies vaginal bleeding or discharge. She describes the pain as sharp and in the upper abdomen. The pain is better with lying still. She pain increased after eating. Current sex partner x 11 years, no history of STI's, last pap smear 3 years ago and was normal.   Past Medical History  Diagnosis Date  . Depression   . Anxiety   . ADD (attention deficit disorder)    No past surgical history on file. Family History  Problem Relation Age of Onset  . Hypertension Mother   . Depression Mother   . ADD / ADHD Mother   . ADD / ADHD Father   . ADD / ADHD Sister   . Cancer Maternal Grandmother   . Cancer Maternal Grandfather    History  Substance Use Topics  . Smoking status: Never Smoker   . Smokeless tobacco: Not on file  . Alcohol Use: No   OB History   Grav Para Term Preterm Abortions TAB SAB Ect Mult Living                 Review of Systems  Constitutional: Negative for fever and chills.  HENT:  Positive for congestion. Negative for ear pain and sore throat.   Eyes: Negative for redness and itching.  Respiratory: Positive for cough. Negative for shortness of breath.   Gastrointestinal: Positive for abdominal pain. Negative for nausea, vomiting, constipation and anorexia.  Genitourinary: Positive for pelvic pain. Negative for dysuria, urgency, frequency, hematuria, vaginal bleeding and vaginal discharge.  Musculoskeletal: Negative for back pain.  Skin: Negative for rash.  Neurological: Negative for syncope and headaches.  Psychiatric/Behavioral: Negative for confusion. The patient is not nervous/anxious.       Allergies  Review of patient's allergies indicates no known allergies.  Home Medications   Prior to Admission medications   Medication Sig Start Date End Date Taking? Authorizing Provider  amphetamine-dextroamphetamine (ADDERALL XR) 20 MG 24 hr capsule Take 1 capsule (20 mg total) by mouth every morning. 01/27/13  Yes Wardell Honour, MD  guaiFENesin-dextromethorphan (ROBITUSSIN DM) 100-10 MG/5ML syrup Take 15 mLs by mouth at bedtime as needed for cough.   Yes Historical Provider, MD   BP 118/86  Pulse 95  Temp(Src) 98.8 F (37.1 C) (Oral)  Resp 20  Ht 5' 2.5" (1.588 m)  Wt 178 lb 12.8 oz (81.103 kg)  BMI 32.16 kg/m2  SpO2 97%  LMP 01/14/2014 Physical Exam  Nursing note and vitals reviewed. Constitutional: She is oriented to person, place,  and time. She appears well-developed and well-nourished.  HENT:  Head: Normocephalic.  Eyes: EOM are normal.  Neck: Neck supple.  Cardiovascular: Normal rate.   Pulmonary/Chest: Effort normal.  Abdominal: Soft. There is tenderness in the right lower quadrant, suprapubic area and left lower quadrant. There is no rebound, no guarding and no CVA tenderness.  Genitourinary:  External genitalia without lesions, white discharge vaginal vault. Mild CMT, bilateral adnexal tenderness left >right. Uterus without palpable enlargement.    Musculoskeletal: Normal range of motion.  Neurological: She is alert and oriented to person, place, and time. No cranial nerve deficit.  Skin: Skin is warm and dry.  Psychiatric: She has a normal mood and affect. Her behavior is normal.   Results for orders placed during the hospital encounter of 04/07/14 (from the past 24 hour(s))  URINALYSIS, ROUTINE W REFLEX MICROSCOPIC     Status: Abnormal   Collection Time    04/07/14  6:20 PM      Result Value Ref Range   Color, Urine YELLOW  YELLOW   APPearance CLEAR  CLEAR   Specific Gravity, Urine >1.030 (*) 1.005 - 1.030   pH 5.5  5.0 - 8.0   Glucose, UA NEGATIVE  NEGATIVE mg/dL   Hgb urine dipstick NEGATIVE  NEGATIVE   Bilirubin Urine NEGATIVE  NEGATIVE   Ketones, ur 15 (*) NEGATIVE mg/dL   Protein, ur NEGATIVE  NEGATIVE mg/dL   Urobilinogen, UA 0.2  0.0 - 1.0 mg/dL   Nitrite NEGATIVE  NEGATIVE   Leukocytes, UA NEGATIVE  NEGATIVE  CBC WITH DIFFERENTIAL     Status: None   Collection Time    04/07/14  6:37 PM      Result Value Ref Range   WBC 9.6  4.0 - 10.5 K/uL   RBC 4.43  3.87 - 5.11 MIL/uL   Hemoglobin 13.3  12.0 - 15.0 g/dL   HCT 38.9  36.0 - 46.0 %   MCV 87.8  78.0 - 100.0 fL   MCH 30.0  26.0 - 34.0 pg   MCHC 34.2  30.0 - 36.0 g/dL   RDW 13.9  11.5 - 15.5 %   Platelets 229  150 - 400 K/uL   Neutrophils Relative % 59  43 - 77 %   Neutro Abs 5.7  1.7 - 7.7 K/uL   Lymphocytes Relative 34  12 - 46 %   Lymphs Abs 3.3  0.7 - 4.0 K/uL   Monocytes Relative 6  3 - 12 %   Monocytes Absolute 0.6  0.1 - 1.0 K/uL   Eosinophils Relative 1  0 - 5 %   Eosinophils Absolute 0.1  0.0 - 0.7 K/uL   Basophils Relative 0  0 - 1 %   Basophils Absolute 0.0  0.0 - 0.1 K/uL  ABO/RH     Status: None   Collection Time    04/07/14  6:37 PM      Result Value Ref Range   ABO/RH(D) A POS    HCG, QUANTITATIVE, PREGNANCY     Status: None   Collection Time    04/07/14  6:37 PM      Result Value Ref Range   hCG, Beta Chain, Quant, S <1  <5 mIU/mL   WET PREP, GENITAL     Status: Abnormal   Collection Time    04/07/14  7:10 PM      Result Value Ref Range   Yeast Wet Prep HPF POC NONE SEEN  NONE SEEN   Trich, Wet  Prep NONE SEEN  NONE SEEN   Clue Cells Wet Prep HPF POC FEW (*) NONE SEEN   WBC, Wet Prep HPF POC FEW (*) NONE SEEN    US Transvaginal Non-ob  04/07/2014   CLINICAL DATA:  Pelvic pain  EXAM: TRANSABDOMINAL ULTRASOUND OF PELVIS  TECHNIQUE: Transabdominal ultrasound examination of the pelvis was performed including evaluation of the uterus, ovaries, adnexal regions, and pelvic cul-de-sac.  COMPARISON:  None.  FINDINGS: Uterus  Measurements: 8.3 x 5 x 4 x 6.3 cm. There are 2 masses in the left uterus. In the left fundus there is a 20 x 20 x 18 mm mass, and in the left body of the uterus there is an 18 x 11 x 12 mm mass, both of which hypoechoic  Endometrium  Thickness: 6 mm.  No focal abnormality visualized.  Right ovary  Not visualized  Left ovary  Measurements: 29 x 22 x 21 mm. There is a 20 x 17 x 29 mm left ovarian cystic lesion, partially anechoic, but with internal echoes and several thin internal septations.  Other findings:  No free fluid  IMPRESSION: 1. Two small uterine mass is likely fibroids 2. 2.9 cm complex cystic lesion left ovary. This could represent a complicated hemorrhagic cyst. Follow-up pelvic ultrasound in 6-12 weeks recommended. This recommendation follows the consensus statement: Management of Asymptomatic Ovarian and Other Adnexal Cysts Imaged at [place]: Society of Radiologists in [place]. Radiology 2010; [id number].   Electronically Signed   By: Skipper Cliche M.D.   On: 04/07/2014 20:08   US Pelvis Complete  04/07/2014   CLINICAL DATA:  Pelvic pain  EXAM: TRANSABDOMINAL ULTRASOUND OF PELVIS  TECHNIQUE: Transabdominal ultrasound examination of the pelvis was performed including evaluation of the uterus, ovaries, adnexal regions, and pelvic cul-de-sac.  COMPARISON:  None.  FINDINGS:  Uterus  Measurements: 8.3 x 5 x 4 x 6.3 cm. There are 2 masses in the left uterus. In the left fundus there is a 20 x 20 x 18 mm mass, and in the left body of the uterus there is an 18 x 11 x 12 mm mass, both of which hypoechoic  Endometrium  Thickness: 6 mm.  No focal abnormality visualized.  Right ovary  Not visualized  Left ovary  Measurements: 29 x 22 x 21 mm. There is a 20 x 17 x 29 mm left ovarian cystic lesion, partially anechoic, but with internal echoes and several thin internal septations.  Other findings:  No free fluid  IMPRESSION: 1. Two small uterine mass is likely fibroids 2. 2.9 cm complex cystic lesion left ovary. This could represent a complicated hemorrhagic cyst. Follow-up pelvic ultrasound in 6-12 weeks recommended. This recommendation follows the consensus statement: Management of Asymptomatic Ovarian and Other Adnexal Cysts Imaged at [place]: Society of Radiologists in [place]. Radiology 2010; [id number].   Electronically Signed   By: Skipper Cliche M.D.   On: 04/07/2014 20:08    ED Course  Procedures Labs, ultrasound, pain management.  MDM  38 y.o. female with pelvic pain on exam. Uterine fibroids and ovarian cyst left ovary. I have reviewed this patient's vital signs, nurses notes, appropriate labs and imaging.  I have discussed findings with the patient and plan of care and she voices understanding. She will follow up with the [facility] for her pap smear and to have the cyst and fibroids followed. Will treat pain and she will return here as needed for problems.    Medication List  TAKE these medications       HYDROcodone-acetaminophen 5-325 MG per tablet  Commonly known as:  NORCO  Take 1 tablet by mouth every 6 (six) hours as needed for moderate pain.     naproxen 500 MG tablet  Commonly known as:  NAPROSYN  Take 1 tablet (500 mg total) by mouth 2 (two) times daily.      ASK your doctor about these medications        amphetamine-dextroamphetamine 20 MG 24 hr capsule  Commonly known as:  ADDERALL XR  Take 1 capsule (20 mg total) by mouth every morning.     guaiFENesin-dextromethorphan 100-10 MG/5ML syrup  Commonly known as:  ROBITUSSIN DM  Take 15 mLs by mouth at bedtime as needed for cough.

## 2014-04-08 LAB — GC/CHLAMYDIA PROBE AMP
CT PROBE, AMP APTIMA: NEGATIVE
GC PROBE AMP APTIMA: NEGATIVE

## 2014-04-08 NOTE — MAU Provider Note (Signed)
Attestation of Attending Supervision of Advanced Practitioner (CNM/NP): Evaluation and management procedures were performed by the Advanced Practitioner under my supervision and collaboration.  I have reviewed the Advanced Practitioner's note and chart, and I agree with the management and plan.  CONSTANT,PEGGY 04/08/2014 11:01 AM

## 2014-05-06 ENCOUNTER — Other Ambulatory Visit (HOSPITAL_COMMUNITY)
Admission: RE | Admit: 2014-05-06 | Discharge: 2014-05-06 | Disposition: A | Discharge status: Home / Self Care | Payer: BC Managed Care – PPO | Source: Ambulatory Visit | Attending: Obstetrics & Gynecology | Admitting: Obstetrics & Gynecology

## 2014-05-06 ENCOUNTER — Other Ambulatory Visit: Payer: Self-pay | Admitting: Obstetrics & Gynecology

## 2014-05-06 DIAGNOSIS — Z124 Encounter for screening for malignant neoplasm of cervix: Secondary | ICD-10-CM | POA: Insufficient documentation

## 2014-05-06 DIAGNOSIS — R87810 Cervical high risk human papillomavirus (HPV) DNA test positive: Secondary | ICD-10-CM | POA: Diagnosis not present

## 2014-05-06 DIAGNOSIS — Z1151 Encounter for screening for human papillomavirus (HPV): Secondary | ICD-10-CM | POA: Insufficient documentation

## 2014-05-08 LAB — CYTOLOGY - PAP

## 2014-08-17 ENCOUNTER — Encounter (HOSPITAL_COMMUNITY): Payer: Self-pay | Admitting: *Deleted

## 2015-01-09 IMAGING — US US TRANSVAGINAL NON-OB
1 series · 13 of 25 positions shown · non-contrast
Comparison: None.

CLINICAL DATA: Pelvic pain

EXAM:
TRANSABDOMINAL ULTRASOUND OF PELVIS
TECHNIQUE: Transabdominal ultrasound examination of the pelvis was performed
including evaluation of the uterus, ovaries, adnexal regions, and
pelvic cul-de-sac.

[Series 1: us pelvis complete · 68 acquisitions, 13 frames shown]
[im 1/68]
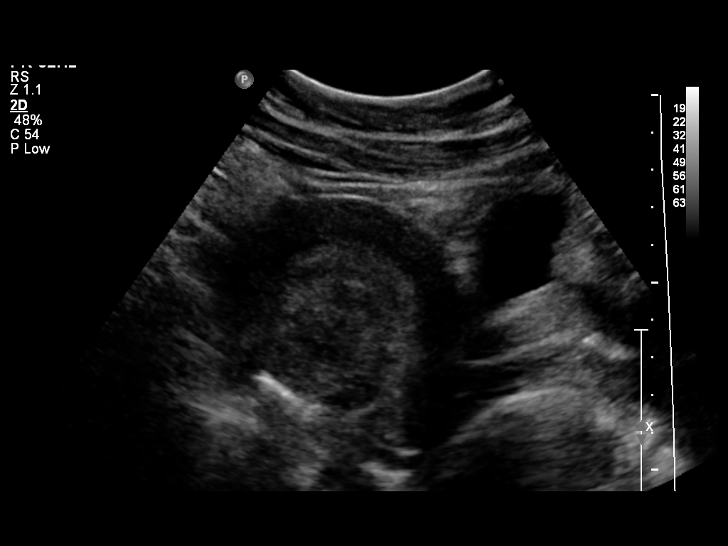
[im 6/68]
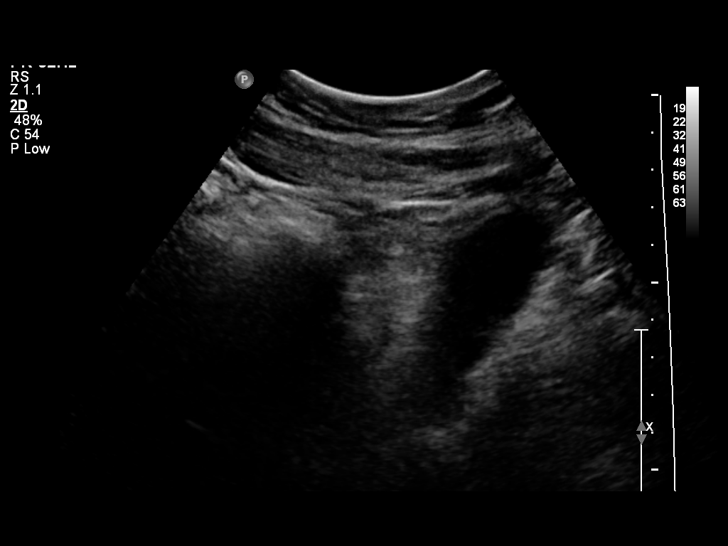
[im 12/68]
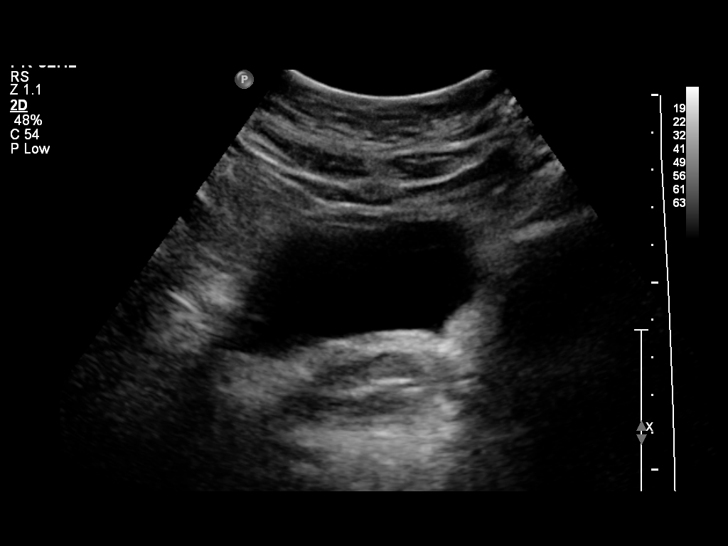
[im 17/68]
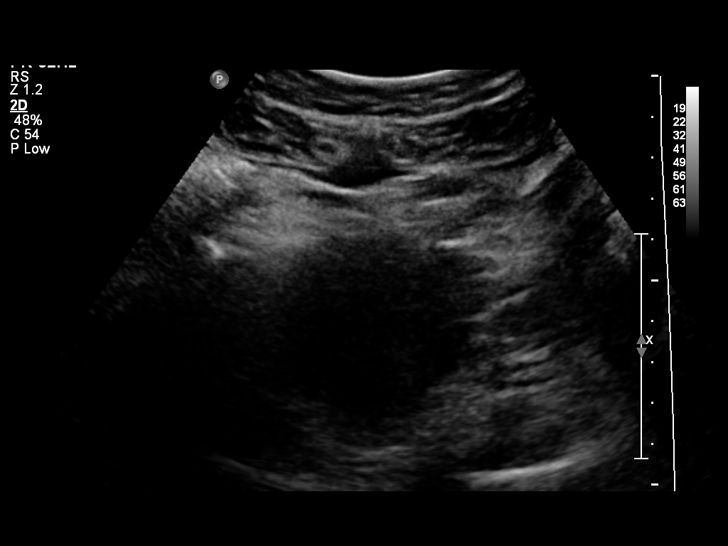
[im 23/68]
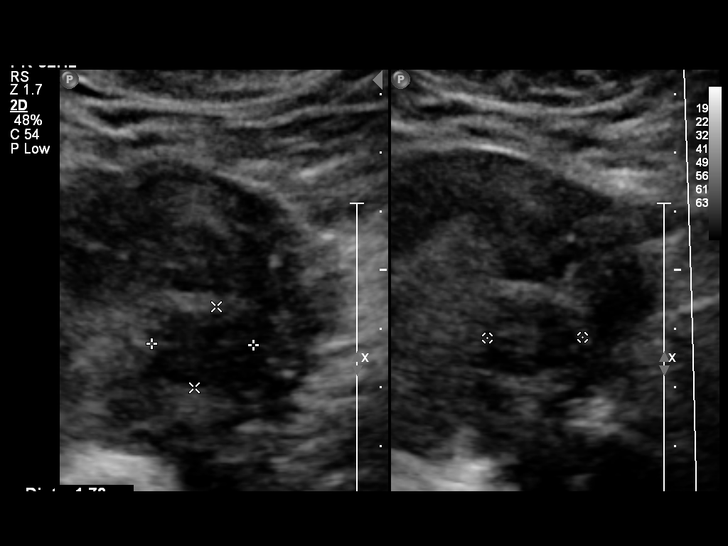
[im 28/68]
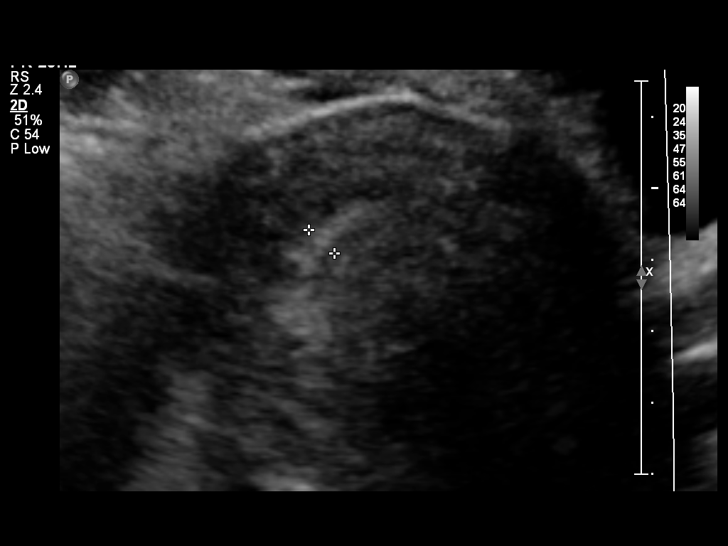
[im 34/68]
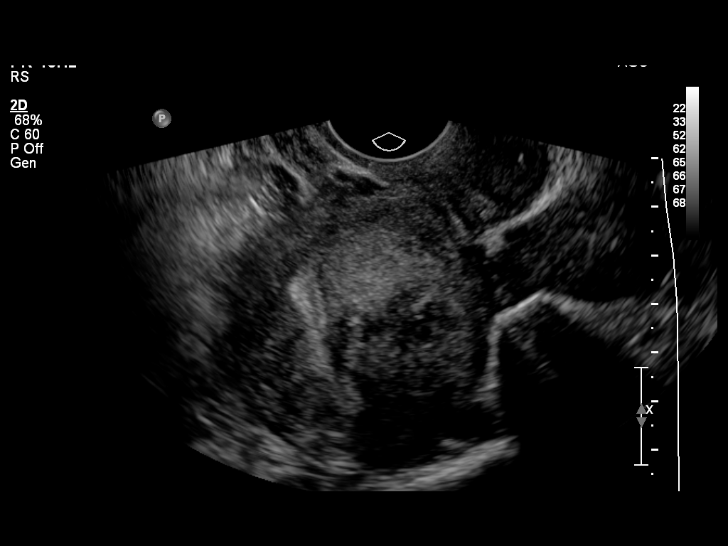
[im 40/68]
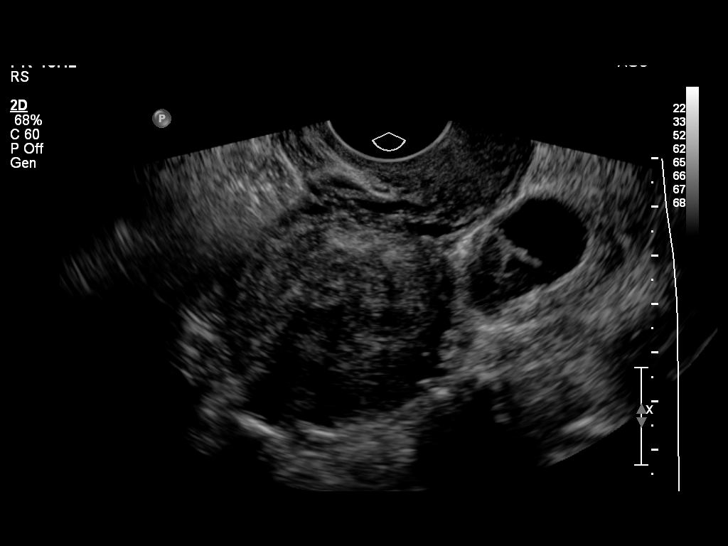
[im 45/68]
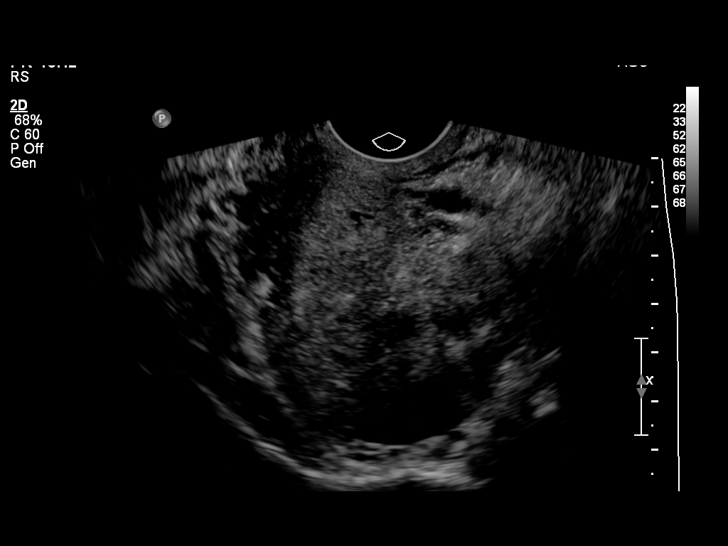
[im 51/68]
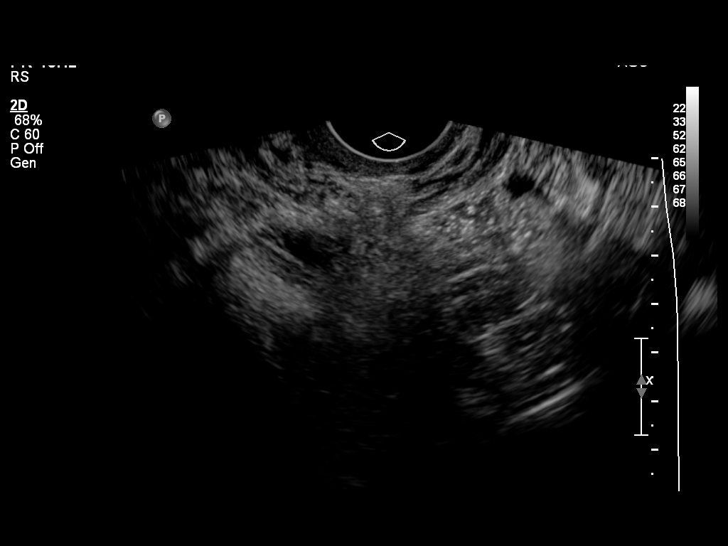
[im 56/68]
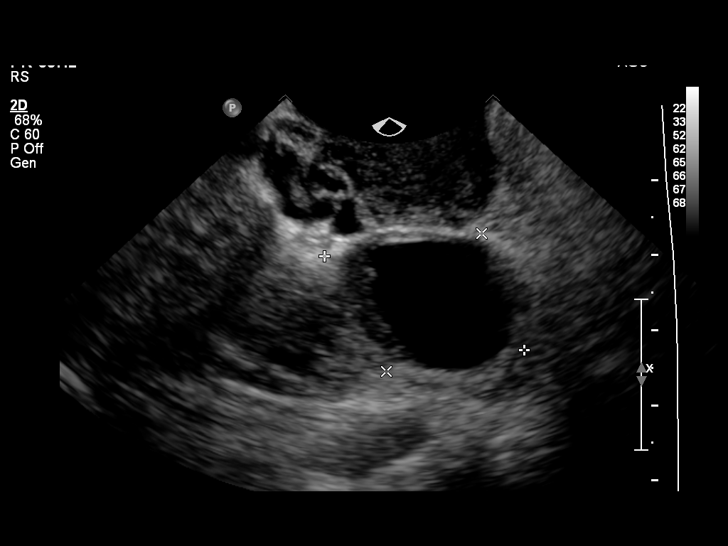
[im 62/68]
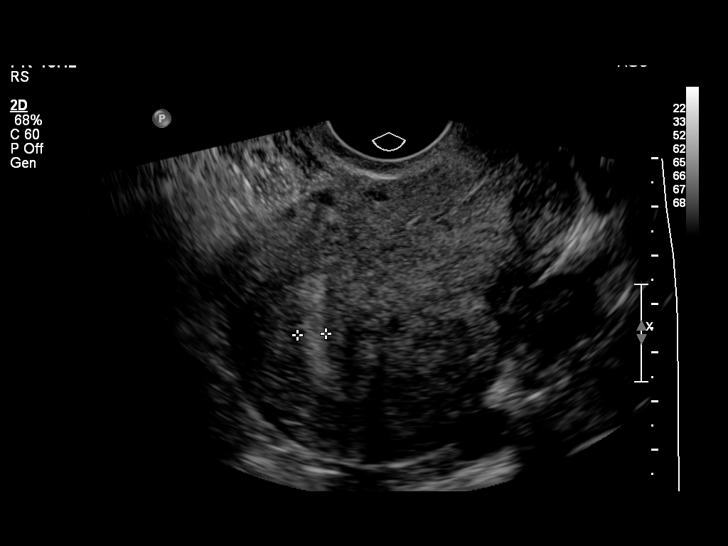
[im 68/68]
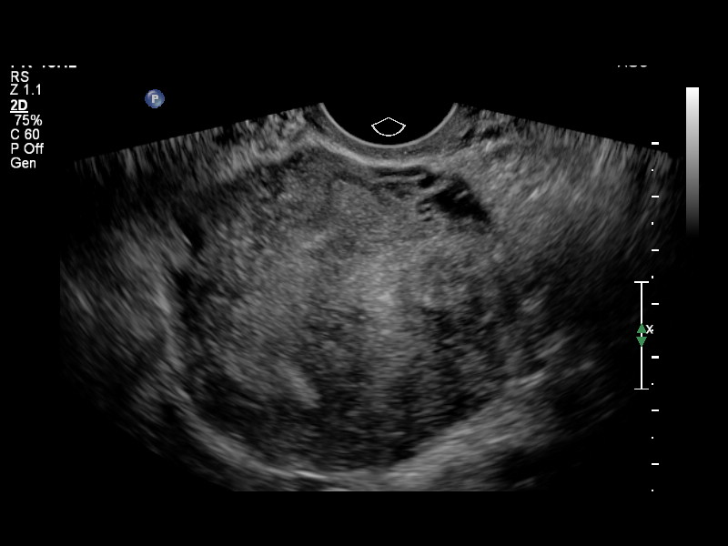

[13 of 25 positions shown; findings below may reference images not displayed]

FINDINGS: Uterus

Measurements: 8.3 x 5 x 4 x 6.3 cm. There are 2 masses in the left
uterus. In the left fundus there is a 20 x 20 x 18 mm mass, and in
the left body of the uterus there is an 18 x 11 x 12 mm mass, both
of which hypoechoic

Endometrium

Thickness: 6 mm.  No focal abnormality visualized.

Right ovary

Not visualized

Left ovary

Measurements: 29 x 22 x 21 mm. There is a 20 x 17 x 29 mm left
ovarian cystic lesion, partially anechoic, but with internal echoes
and several thin internal septations..

Other findings:  No free fluid
IMPRESSION: 1. Two small uterine mass is likely fibroids
2. 2.9 cm complex cystic lesion left ovary. This could represent a
complicated hemorrhagic cyst. Follow-up pelvic ultrasound in 6-12
weeks recommended. This recommendation follows the consensus
statement: Management of Asymptomatic Ovarian and Other Adnexal
Cysts Imaged at [place]: Society of Radiologists in [place] Consensus
